# Patient Record
Sex: Female | Born: 1980 | Race: White | Hispanic: No | Marital: Married | Smoking: Never smoker
Health system: Southern US, Community
[De-identification: ages and names within clinical notes are randomized; demographics above are authoritative.]

## PROBLEM LIST (undated history)

## (undated) DIAGNOSIS — E109 Type 1 diabetes mellitus without complications: Secondary | ICD-10-CM

---

## 2006-10-26 ENCOUNTER — Ambulatory Visit: Payer: Self-pay | Admitting: Internal Medicine

## 2009-05-20 ENCOUNTER — Ambulatory Visit: Payer: Self-pay | Admitting: Unknown Physician Specialty

## 2009-06-06 ENCOUNTER — Ambulatory Visit: Payer: Self-pay | Admitting: Unknown Physician Specialty

## 2013-10-21 ENCOUNTER — Ambulatory Visit: Payer: Self-pay | Admitting: Family Medicine

## 2013-10-21 LAB — URINALYSIS, COMPLETE
Bilirubin,UR: NEGATIVE
Glucose,UR: 1000 mg/dL (ref 0–75)
KETONE: NEGATIVE
Nitrite: NEGATIVE
Ph: 8.5 (ref 4.5–8.0)
Protein: 100
RBC,UR: >30 /HPF (ref 0–5)
Specific Gravity: 1.01 (ref 1.003–1.030)
Squamous Epithelial: NONE SEEN

## 2013-10-23 LAB — URINE CULTURE

## 2015-09-03 ENCOUNTER — Encounter: Payer: Self-pay | Admitting: Emergency Medicine

## 2015-09-03 ENCOUNTER — Ambulatory Visit
Admission: EM | Admit: 2015-09-03 | Discharge: 2015-09-03 | Disposition: A | Discharge status: Home / Self Care | Payer: BC Managed Care – PPO | Attending: Family Medicine | Admitting: Family Medicine

## 2015-09-03 DIAGNOSIS — R202 Paresthesia of skin: Secondary | ICD-10-CM

## 2015-09-03 HISTORY — DX: Type 1 diabetes mellitus without complications: E10.9

## 2015-09-03 MED ORDER — CYCLOBENZAPRINE HCL 10 MG PO TABS: 10.0000 mg | ORAL_TABLET | Freq: Every day | ORAL | Status: DC

## 2015-09-03 NOTE — ED Provider Notes (Signed)
CSN: [id number]     Arrival date & time 09/03/15  1146 History   First MD Initiated Contact with Patient 09/03/15 1358     Chief Complaint  Patient presents with  . Numbness  . Tingling   (Consider location/radiation/quality/duration/timing/severity/associated sxs/prior Treatment) HPI Comments: 34 yo female with a 5 day h/o acute onset of numbness, tingling and weakness to both arms. 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 Patient complains of right sided neck pain. Denies any falls, fevers, chills, rash. Patient is diabetic and states diabetes is well controlled.   The history is provided by the patient.    Past Medical History  Diagnosis Date  . Diabetes type 1, controlled [facility])    Past Surgical History  Procedure Laterality Date  . Cesarean section     History reviewed. No pertinent family history. Social History  Substance Use Topics  . Smoking status: Never Smoker   . Smokeless tobacco: None  . Alcohol Use: Yes   OB History    No data available     Review of Systems  Allergies  Review of patient's allergies indicates no known allergies.  Home Medications   Prior to Admission medications   Medication Sig Start Date End Date Taking? Authorizing Provider  insulin aspart (NOVOLOG) 100 UNIT/ML injection Inject into the skin 3 (three) times daily before meals.   Yes Historical Provider, MD  insulin glargine (LANTUS) 100 UNIT/ML injection Inject 12 Units into the skin 2 (two) times daily.   Yes Historical Provider, MD  lisinopril (PRINIVIL,ZESTRIL) 5 MG tablet Take 5 mg by mouth daily.   Yes Historical Provider, MD  cyclobenzaprine (FLEXERIL) 10 MG tablet Take 1 tablet (10 mg total) by mouth at bedtime. 09/03/15   Payton Mccallum, MD   Meds Ordered and Administered this Visit  Medications - No data to display  BP 125/74 mmHg  Pulse 58  Temp(Src) 98.2 F (36.8 C) (Oral)  Resp 16   Ht  (1.651 m)  Wt 148 lb (67.132 kg)  BMI 24.63 kg/m2  SpO2 100%  LMP 08/19/2015 No data found.   Physical Exam  Constitutional: She is oriented to person, place, and time. She appears well-developed and well-nourished. No distress.  HENT:  Head: Normocephalic and atraumatic.  Mouth/Throat: Oropharynx is clear and moist. No oropharyngeal exudate.  Eyes: EOM are normal. Pupils are equal, round, and reactive to light.  Neck: Normal range of motion. Neck supple. No tracheal deviation present.  Cardiovascular: Normal rate, regular rhythm and normal heart sounds.   Pulmonary/Chest: Effort normal and breath sounds normal. No stridor. No respiratory distress. She has no wheezes. She has no rales.  Musculoskeletal: She exhibits no edema.       Cervical back: She exhibits tenderness (over the right cervical paraspinous muscles and right trapezius muscle) and spasm. She exhibits normal range of motion, no bony tenderness, no swelling, no edema, no deformity and no laceration.       Back:  Lymphadenopathy:    She has no cervical adenopathy.  Neurological: She is alert and oriented to person, place, and time. She has normal reflexes. She displays no atrophy, no tremor and normal reflexes. No cranial nerve deficit or sensory deficit. She exhibits normal muscle tone. Coordination normal.  Positive phalen's test  Skin: No rash noted. She is not diaphoretic.  Nursing note and vitals reviewed.   ED Course  Procedures (including  critical care time)  Labs Review Labs Reviewed - No data to display  Imaging Review No results found.   Visual Acuity Review  Right Eye Distance:   Left Eye Distance:   Bilateral Distance:    Right Eye Near:   Left Eye Near:    Bilateral Near:         MDM   1. Paresthesias   (possibly multifactorial, including possibility of secondary to recent muscular strain)  Discharge Medication List as of 09/03/2015  2:36 PM    START taking these  medications   Details  cyclobenzaprine (FLEXERIL) 10 MG tablet Take 1 tablet (10 mg total) by mouth at bedtime., Starting 09/03/2015, Until Discontinued, Normal       1. diagnosis reviewed with patient 2. rx as per orders above; reviewed possible side effects, interactions, risks and benefits  3. Recommend supportive treatment with gentle stretches, rest, heat 4. Follow-up prn if symptoms worsen or don't improve    Payton Mccallumrlando Stephania Macfarlane, MD 09/17/15 1009

## 2015-09-03 NOTE — ED Notes (Signed)
Numbness and tingling and weakness in both arms started Friday. 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 Pt also now having pain R axillae, and right neck pain.

## 2016-09-10 ENCOUNTER — Ambulatory Visit
Admission: EM | Admit: 2016-09-10 | Discharge: 2016-09-10 | Disposition: A | Discharge status: Home / Self Care | Payer: BC Managed Care – PPO | Attending: Family Medicine | Admitting: Family Medicine

## 2016-09-10 ENCOUNTER — Encounter: Payer: Self-pay | Admitting: Emergency Medicine

## 2016-09-10 DIAGNOSIS — J40 Bronchitis, not specified as acute or chronic: Secondary | ICD-10-CM | POA: Diagnosis not present

## 2016-09-10 MED ORDER — PREDNISONE 20 MG PO TABS: 20.0000 mg | ORAL_TABLET | Freq: Every day | ORAL | 0 refills | Status: AC

## 2016-09-10 MED ORDER — AZITHROMYCIN 250 MG PO TABS: ORAL_TABLET | ORAL | 0 refills | Status: DC

## 2016-09-10 MED ORDER — HYDROCOD POLST-CPM POLST ER 10-8 MG/5ML PO SUER: 5.0000 mL | Freq: Every evening | ORAL | 0 refills | Status: DC | PRN

## 2016-09-10 MED ORDER — ALBUTEROL SULFATE HFA 108 (90 BASE) MCG/ACT IN AERS: 2.0000 | INHALATION_SPRAY | RESPIRATORY_TRACT | 0 refills | Status: DC | PRN

## 2016-09-10 MED ORDER — IPRATROPIUM-ALBUTEROL 0.5-2.5 (3) MG/3ML IN SOLN
3.0000 mL | Freq: Once | RESPIRATORY_TRACT | Status: AC
  Administered 2016-09-10: 3 mL via RESPIRATORY_TRACT

## 2016-09-10 NOTE — Discharge Instructions (Signed)
Take medication as prescribed. Rest. Drink plenty of fluids.  ° °Follow up with your primary care physician this week as needed. Return to Urgent care for new or worsening concerns.  ° °

## 2016-09-10 NOTE — ED Triage Notes (Signed)
Patient c/o cough and chest congestion for the past 3 weeks.   

## 2016-09-10 NOTE — ED Provider Notes (Signed)
[facility] ____________________________________________  Time seen: Approximately 6:43 PM  I have reviewed the triage vital signs and the nursing notes.   HISTORY  Chief Complaint Cough   HPI Amy Ryan is a 36 y.o. female presents with complaints of 2-3 weeks of cough and chest congestion. Patient reports that symptoms initially started off like a typical called with nasal congestion, sore throat and cough. Patient reports nasal congestion has resolved but cough discontinued. Patient reports she does have intermittent chest tightness with associated wheezing. Reports postnasal drainage. Reports symptoms have been unresolved with over-the-counter Mucinex. Denies fevers. Reports multiple family contacts with cough and congestion symptoms. Reports continues to eat and drink well. Patient reports feels like she has coughed to where she had pulled muscles in her back as she has intermittently feels soreness that's present with movement only. Denies resting soreness or pain. Denies any chest pain, shortness of breath or chest pain with deep breath. Denies fevers, dysuria, abdominal pain, extremity pain or extremity swelling. Denies recent antibiotic use.  Patient's last menstrual period was 08/17/2016 (exact date). Denies pregnancy.  Reports type one diabetic, reports blood sugars have been stable recently with current being 144.    Past Medical History:  Diagnosis Date  . Diabetes type 1, controlled ([facility])     There are no active problems to display for this patient.   Past Surgical History:  Procedure Laterality Date  . CESAREAN SECTION      Current Outpatient Rx  . Order #: [id number] Class: Normal  . Order #: [id number] Class: Normal  . Order #: [id number] Class: Print  . Order #: [id number] Class: Historical Med  . Order #: [id number] Class: Historical Med  . Order #: [id number] Class: Normal    No current facility-administered medications for this encounter.    Current Outpatient Prescriptions:  .  albuterol (PROVENTIL HFA;VENTOLIN HFA) 108 (90 Base) MCG/ACT inhaler, Inhale 2 puffs into the lungs every 4 (four) hours as needed., Disp: 1 Inhaler, Rfl: 0 .  azithromycin (ZITHROMAX Z-PAK) 250 MG tablet, Take 2 tablets (500 mg) on  Day 1,  followed by 1 tablet (250 mg) once daily on Days 2 through 5., Disp: 6 each, Rfl: 0 .  chlorpheniramine-HYDROcodone (TUSSIONEX PENNKINETIC ER) 10-8 MG/5ML SUER, Take 5 mLs by mouth at bedtime as needed for cough. do not drive or operate machinery while taking as can cause drowsiness., Disp: 75 mL, Rfl: 0 .  insulin aspart (NOVOLOG) 100 UNIT/ML injection, Inject into the skin 3 (three) times daily before meals., Disp: , Rfl:  .  insulin glargine (LANTUS) 100 UNIT/ML injection, Inject 12 Units into the skin 2 (two) times daily., Disp: , Rfl:  .  predniSONE (DELTASONE) 20 MG tablet, Take 1 tablet (20 mg total) by mouth daily., Disp: 3 tablet, Rfl: 0  Allergies Patient has no known allergies.  History reviewed. No pertinent family history.  Social History Social History  Substance Use Topics  . Smoking status: Never Smoker  . Smokeless tobacco: Never Used  . Alcohol use Yes    Review of Systems Constitutional: As above. Eyes: No visual changes. ENT: As above. Cardiovascular: Denies chest pain. Respiratory: Denies shortness of breath. Gastrointestinal: No abdominal pain.  No nausea, no vomiting.  No diarrhea.  No constipation. Genitourinary: Negative for dysuria. Musculoskeletal: Negative for back pain. Skin: Negative for rash. Neurological: Negative for headaches, focal weakness or numbness.  10-point ROS otherwise negative.  ____________________________________________   PHYSICAL EXAM:  VITAL SIGNS: ED Triage Vitals  Enc Vitals Group  BP 09/10/16 1754 124/70     Pulse Rate 09/10/16 1754 74     Resp 09/10/16 1754 17     Temp 09/10/16 1754 98.5 F (36.9 C)     Temp Source 09/10/16 1754 Oral      SpO2 09/10/16 1754 100 %     Weight 09/10/16 1754 152 lb (68.9 kg)     Height 09/10/16 1754 5\' 5"  (1.651 m)     Head Circumference --      Peak Flow --      Pain Score 09/10/16 1755 0     Pain Loc --      Pain Edu? --      Excl. in [place]? --     Constitutional: Alert and oriented. Well appearing and in no acute distress. Eyes: Conjunctivae are normal. PERRL. EOMI. Head: Atraumatic. No sinus tenderness to palpation. No swelling. No erythema.  Ears: no erythema, normal TMs bilaterally.   Nose:Nasal congestion with clear rhinorrhea  Mouth/Throat: Mucous membranes are moist. No pharyngeal erythema. No tonsillar swelling or exudate.  Neck: No stridor.  No cervical spine tenderness to palpation. Hematological/Lymphatic/Immunilogical: No cervical lymphadenopathy. Cardiovascular: Normal rate, regular rhythm. Grossly normal heart sounds.  Good peripheral circulation. Respiratory: Normal respiratory effort.  No retractions. Mild scattered inspiratory wheezes. Mild scattered rhonchi. No focal area of consolidation auscultated. Speaks in complete sentences. Good air movement. Chest nontender to palpation. Gastrointestinal: Soft and nontender. No CVA tenderness. Musculoskeletal: Ambulatory with steady gait. Bilateral pedal pulses equal and easily palpated. No calf tenderness bilaterally. No cervical, thoracic or lumbar tenderness to palpation.Mild pain to right latissimus dorsi with overhead stretching, and per patient consistent with her reported pain. Neurologic:  Normal speech and language. No gait instability. Skin:  Skin is warm, dry and intact. No rash noted. Psychiatric: Mood and affect are normal. Speech and behavior are normal. ___________________________________________   LABS (all labs ordered are listed, but only abnormal results are displayed)  Labs Reviewed - No data to display  ___________________________________________   PROCEDURES Procedures    INITIAL IMPRESSION /  ASSESSMENT AND PLAN / ED COURSE  Pertinent labs & imaging results that were available during my care of the patient were reviewed by me and considered in my medical decision making (see chart for details).  Well-appearing patient. No acute distress. Presents for the complaints of 2-3 weeks of cough and chest congestion. After DuoNeb, lungs reevaluated, wheezes much improved. Patient reports cough has also improved and she is feeling better. No focal area of consolidation auscultated. Discussed with patient evaluation of chest x-ray, will defer chest x-ray at this time and patient agrees to this. Patient reports has tolerated prednisone well in the past, but reports larger doses over a week of prednisone increased her blood sugar and she had difficulty controlling it, will treat with 20 mg prednisone 3 days; patient agrees to this. Will also treat with oral azithromycin, when necessary albuterol inhaler and when necessary Tussionex at night. Encourage rest, fluids and supportive care. Discussed strict follow-up and return parameters.Discussed indication, risks and benefits of medications with patient.  Discussed follow up with Primary care physician this week. Discussed follow up and return parameters including no resolution or any worsening concerns. Patient verbalized understanding and agreed to plan.   ____________________________________________   FINAL CLINICAL IMPRESSION(S) / ED DIAGNOSES  Final diagnoses:  Bronchitis     Discharge Medication List as of 09/10/2016  6:50 PM    START taking these medications   Details  albuterol (PROVENTIL  HFA;VENTOLIN HFA) 108 (90 Base) MCG/ACT inhaler Inhale 2 puffs into the lungs every 4 (four) hours as needed., Starting Fri 09/10/2016, Normal    azithromycin (ZITHROMAX Z-PAK) 250 MG tablet Take 2 tablets (500 mg) on  Day 1,  followed by 1 tablet (250 mg) once daily on Days 2 through 5., Normal    chlorpheniramine-HYDROcodone (TUSSIONEX PENNKINETIC ER)  10-8 MG/5ML SUER Take 5 mLs by mouth at bedtime as needed for cough. do not drive or operate machinery while taking as can cause drowsiness., Starting Fri 09/10/2016, Print    predniSONE (DELTASONE) 20 MG tablet Take 1 tablet (20 mg total) by mouth daily., Starting Fri 09/10/2016, Until Mon 09/13/2016, Normal        Note: This dictation was prepared with Dragon dictation along with smaller phrase technology. Any transcriptional errors that result from this process are unintentional.    Clinical Course       Renford Dills, NP 09/10/16 1926    Renford Dills, NP 09/10/16 1928

## 2019-03-15 ENCOUNTER — Other Ambulatory Visit: Payer: Self-pay

## 2019-03-15 ENCOUNTER — Ambulatory Visit
Admission: EM | Admit: 2019-03-15 | Discharge: 2019-03-15 | Disposition: A | Discharge status: Home / Self Care | Payer: BC Managed Care – PPO | Attending: Emergency Medicine | Admitting: Emergency Medicine

## 2019-03-15 ENCOUNTER — Encounter: Payer: Self-pay | Admitting: Emergency Medicine

## 2019-03-15 ENCOUNTER — Ambulatory Visit (INDEPENDENT_AMBULATORY_CARE_PROVIDER_SITE_OTHER): Payer: BC Managed Care – PPO

## 2019-03-15 DIAGNOSIS — J069 Acute upper respiratory infection, unspecified: Secondary | ICD-10-CM

## 2019-03-15 DIAGNOSIS — R05 Cough: Secondary | ICD-10-CM | POA: Diagnosis not present

## 2019-03-15 DIAGNOSIS — Z7189 Other specified counseling: Secondary | ICD-10-CM

## 2019-03-15 DIAGNOSIS — B9789 Other viral agents as the cause of diseases classified elsewhere: Secondary | ICD-10-CM

## 2019-03-15 DIAGNOSIS — R0602 Shortness of breath: Secondary | ICD-10-CM

## 2019-03-15 LAB — RAPID STREP SCREEN (MED CTR MEBANE ONLY): Streptococcus, Group A Screen (Direct): NEGATIVE

## 2019-03-15 NOTE — ED Triage Notes (Signed)
Patient states she developed a sore throat and cough last week and SOB developed last night

## 2019-03-15 NOTE — Discharge Instructions (Addendum)
Rest.  Drink plenty of fluids.  Monitor self.  Over-the-counter medications as needed.  Please refer to [facility] DHHS information and follow.  Remain home unless seeking reevaluation.  COVID-19 testing.  Follow up with your primary care physician this week as needed. Return to Urgent care for new or worsening concerns.

## 2019-03-15 NOTE — ED Provider Notes (Signed)
[facility] ____________________________________________  Time seen: Approximately 3:55 PM  I have reviewed the triage vital signs and the nursing notes.   HISTORY  Chief Complaint Shortness of Breath   HPI Amy Ryan is a 38 y.o. female history of type 1 diabetes presenting for evaluation of 1 week of sore scratchy throat.  Patient reports sore throat has been overall mild and she has had a cough like she is clearing her throat.  States in the last 2 days she has had some shortness of breath describes as chest tightness sensation.  Denies chest pain or palpitations.  No known wheezing.  States she is not really short of breath but again a tightness sensation in her lungs.  Cough is nonproductive and dry.  No nasal congestion.  Denies fevers.  Did have some chills yesterday.  Has not been taken medications for the same complaints.  Denies alleviating measures.  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  Denies home sick contacts.  Has continued remain active.  Denies rash, extremity swelling, changes in taste or smell.  Reports overall she is feeling well.  Medicine, Luvenia Heller Family: PCP   Past Medical History:  Diagnosis Date  . Diabetes type 1, controlled ([place])     There are no active problems to display for this patient.   Past Surgical History:  Procedure Laterality Date  . CESAREAN SECTION       No current facility-administered medications for this encounter.   Current Outpatient Medications:  .  insulin aspart (NOVOLOG) 100 UNIT/ML injection, Inject into the skin 3 (three) times daily before meals., Disp: , Rfl:  .  insulin glargine (LANTUS) 100 UNIT/ML injection, Inject 12 Units into the skin 2 (two) times daily., Disp: , Rfl:   Allergies Patient has no known allergies.  Family History  Problem Relation Age of Onset  . Hypertension Father     Social History Social History   Tobacco Use  . Smoking status: Never Smoker  . Smokeless  tobacco: Never Used  Substance Use Topics  . Alcohol use: Yes  . Drug use: Not on file    Review of Systems Constitutional: No fever ENT: No sore throat. Cardiovascular: Denies chest pain. Respiratory: As above. Gastrointestinal: No abdominal pain.  No nausea, no vomiting.  No diarrhea. Genitourinary: Negative for dysuria. Musculoskeletal: Negative for back pain. Skin: Negative for rash. Neurological: Negative for headaches, focal weakness or numbness.    ____________________________________________   PHYSICAL EXAM:  VITAL SIGNS: ED Triage Vitals  Enc Vitals Group     BP 03/15/19 1512 121/82     Pulse Rate 03/15/19 1512 69     Resp 03/15/19 1512 18     Temp 03/15/19 1512 98.3 F (36.8 C)     Temp Source 03/15/19 1512 Oral     SpO2 03/15/19 1512 100 %     Weight 03/15/19 1515 149 lb (67.6 kg)     Height 03/15/19 1515 5\' 5"  (1.651 m)     Head Circumference --      Peak Flow --      Pain Score 03/15/19 1515 0     Pain Loc --      Pain Edu? --      Excl. in [place]? --     Constitutional: Alert and oriented. Well appearing and in no acute distress. Eyes: Conjunctivae are normal.  Head: Atraumatic. No sinus tenderness to palpation. No swelling. No erythema.  Ears: no erythema, normal TMs bilaterally.   Nose:  No nasal congestion  Mouth/Throat: Mucous membranes are moist. No pharyngeal erythema. No tonsillar swelling or exudate.  Neck: No stridor.  No cervical spine tenderness to palpation. Hematological/Lymphatic/Immunilogical: No cervical lymphadenopathy. Cardiovascular: Normal rate, regular rhythm. Grossly normal heart sounds.  Good peripheral circulation. Respiratory: Normal respiratory effort.  No retractions. No wheezes, rales or rhonchi. Good air movement.  Musculoskeletal: Ambulatory with steady gait.  No extremity edema noted bilaterally. Neurologic:  Normal speech and language. No gait instability. Skin:  Skin appears warm, dry and intact. No rash noted.  Psychiatric: Mood and affect are normal. Speech and behavior are normal. ___________________________________________   LABS (all labs ordered are listed, but only abnormal results are displayed)  Labs Reviewed  RAPID STREP SCREEN ([facility] ONLY)  CULTURE, GROUP A STREP [facility])    RADIOLOGY  Dg Chest 2 View  Result Date: 03/15/2019 CLINICAL DATA:  Shortness of breath and cough for several days EXAM: CHEST - 2 VIEW COMPARISON:  None. FINDINGS: Cardiac shadows within normal limits. The lungs are well aerated bilaterally. No focal infiltrate or sizable effusion is seen. Bilateral breast implants are noted. No acute bony abnormality is noted. IMPRESSION: No acute abnormality seen. Electronically Signed   By: Alcide CleverMark  Lukens M.D.   On: 03/15/2019 16:17   ____________________________________________   PROCEDURES Procedures   INITIAL IMPRESSION / ASSESSMENT AND PLAN / ED COURSE  Pertinent labs & imaging results that were available during my care of the patient were reviewed by me and considered in my medical decision making (see chart for details).  Well-appearing patient.  No acute distress.  Suspect viral illness.  Strep negative, will culture.  Chest x-ray unremarkable.  Recommend COVID-19 testing.  COVID-19 testing ordered.  [place] DHHS information and guidelines given and instructed patient to follow.  Seek immediate reevaluation for any worsening concerns.  Remain home.  Patient declines need for cough medication.  Discussed follow up with Primary care physician this week. Discussed follow up and return parameters including no resolution or any worsening concerns. Patient verbalized understanding and agreed to plan.   ____________________________________________   FINAL CLINICAL IMPRESSION(S) / ED DIAGNOSES  Final diagnoses:  Viral URI with cough  SOB (shortness of breath)  Advice Given About Covid-19 Virus Infection     ED Discharge Orders    None       Note: This  dictation was prepared with Dragon dictation along with smaller phrase technology. Any transcriptional errors that result from this process are unintentional.         Renford DillsMiller, Arayna Illescas, NP 03/15/19 1629

## 2019-03-16 ENCOUNTER — Other Ambulatory Visit: Payer: BC Managed Care – PPO

## 2019-03-16 ENCOUNTER — Telehealth: Payer: Self-pay | Admitting: General Practice

## 2019-03-16 DIAGNOSIS — Z20822 Contact with and (suspected) exposure to covid-19: Secondary | ICD-10-CM

## 2019-03-16 NOTE — Addendum Note (Signed)
Addended by: Dimple Nanas on: 03/16/2019 11:33 AM   Modules accepted: Orders

## 2019-03-16 NOTE — Telephone Encounter (Signed)
Called pt, LVM to return call to schedule covid testing.

## 2019-03-16 NOTE — Telephone Encounter (Signed)
Patient called back was scheduled for 03/16/2019 12 pm

## 2019-03-18 LAB — CULTURE, GROUP A STREP (THRC)

## 2019-03-22 LAB — NOVEL CORONAVIRUS, NAA: SARS-CoV-2, NAA: NOT DETECTED

## 2019-03-26 ENCOUNTER — Encounter (HOSPITAL_COMMUNITY): Payer: Self-pay

## 2020-11-09 IMAGING — CR CHEST - 2 VIEW
2 series · 2 of 2 positions shown · non-contrast
Comparison: None.

CLINICAL DATA: Shortness of breath and cough for several days

EXAM:
CHEST - 2 VIEW

[chest pa]
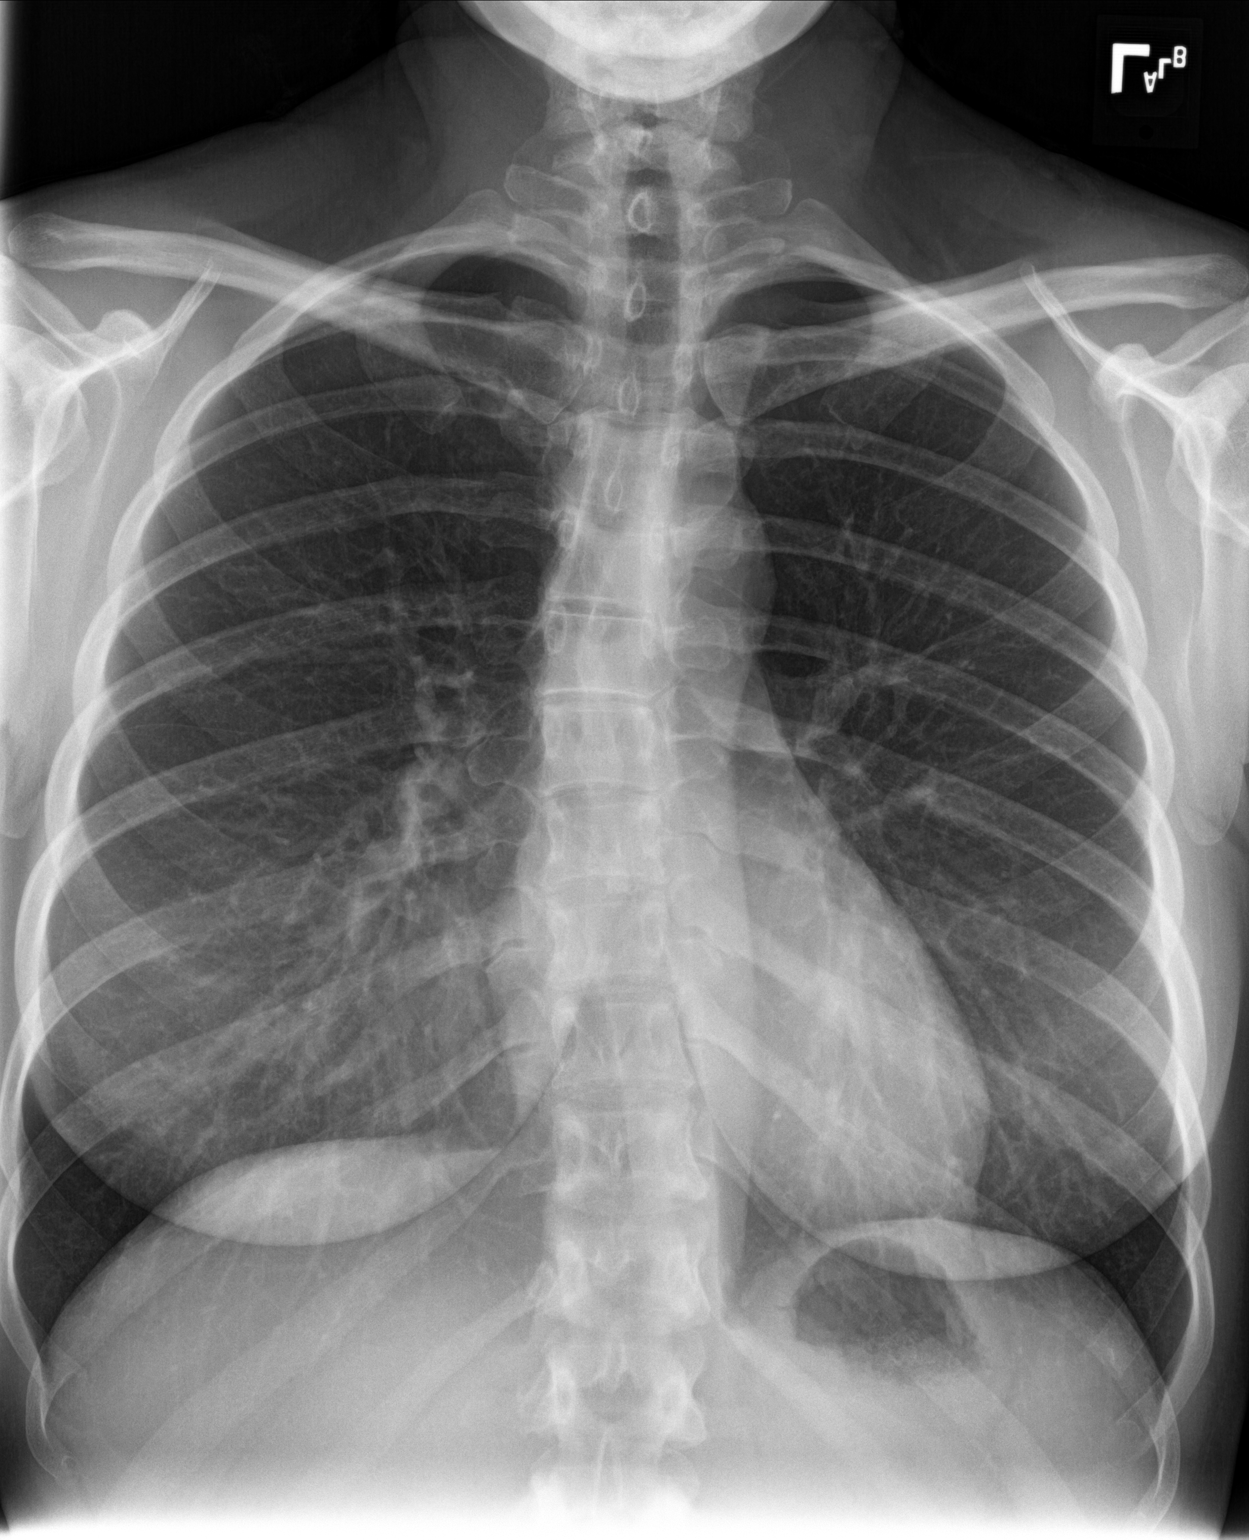

[chest lat]
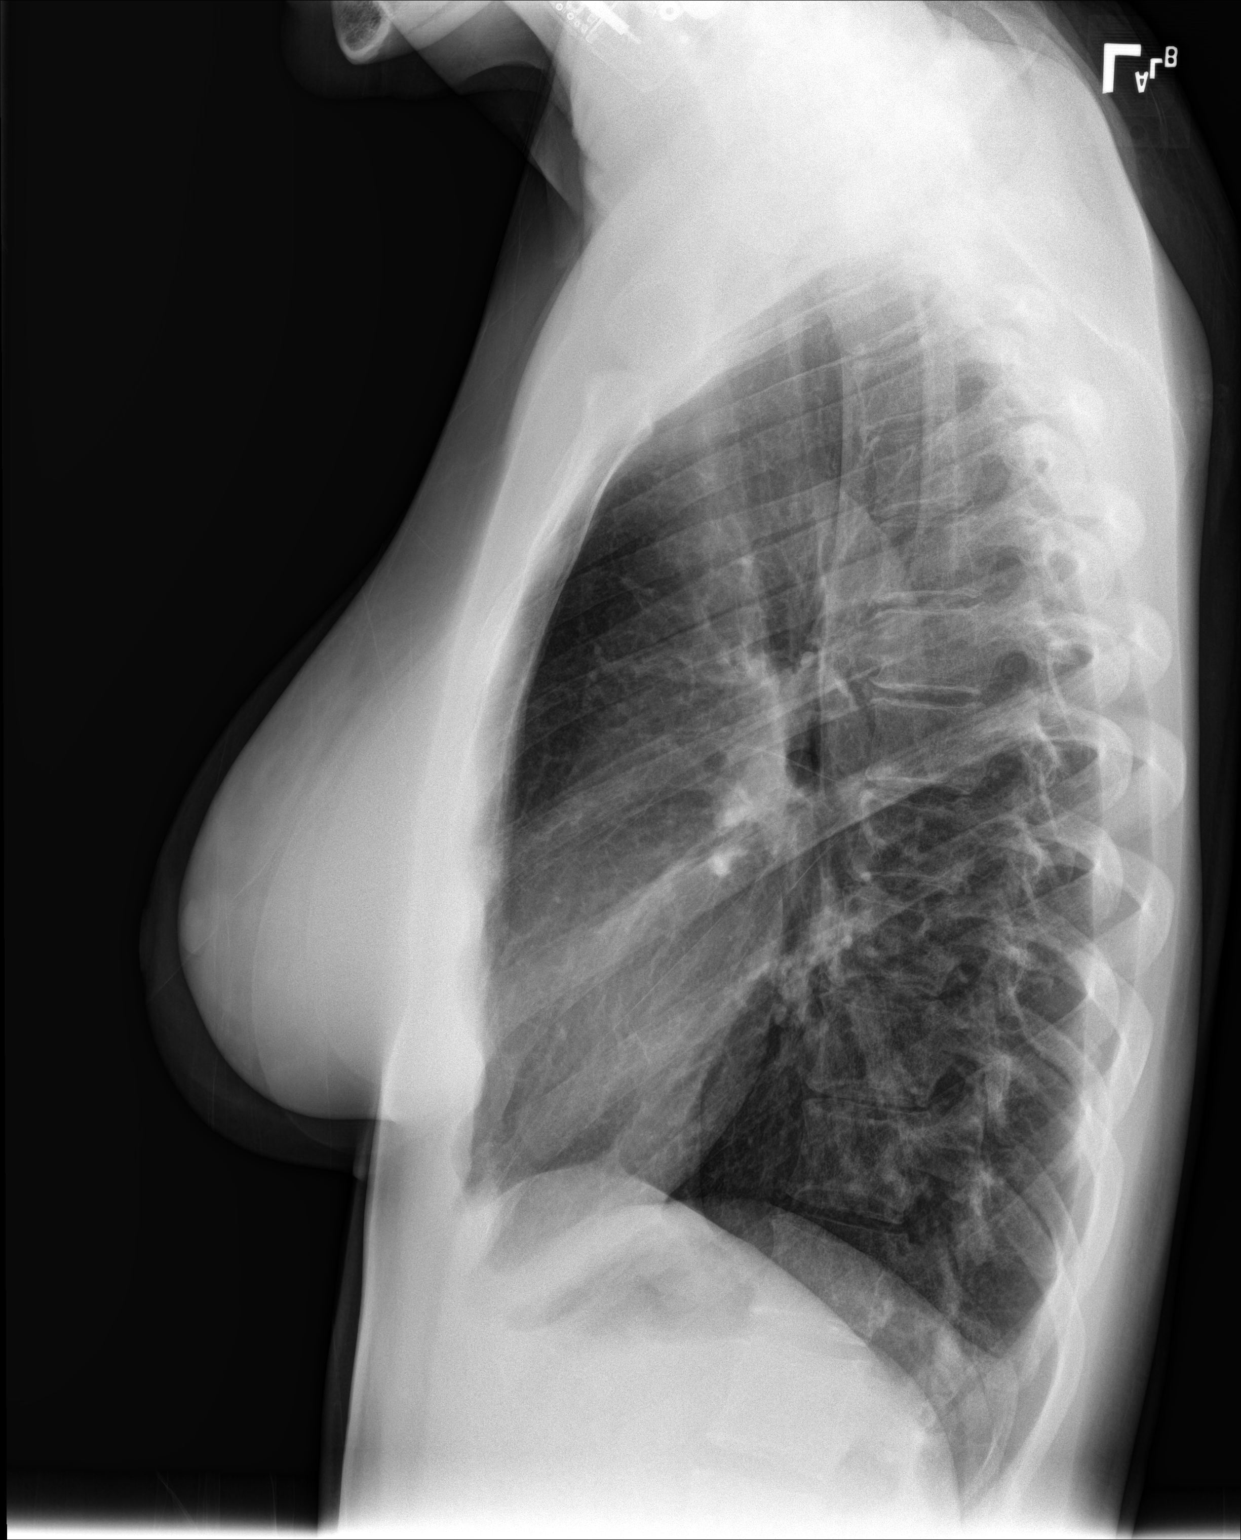

[2 of 2 positions shown; findings below may reference images not displayed]

FINDINGS: Cardiac shadows within normal limits. The lungs are well aerated
bilaterally. No focal infiltrate or sizable effusion is seen.
Bilateral breast implants are noted. No acute bony abnormality is
noted.
IMPRESSION: No acute abnormality seen.
